# Patient Record
Sex: Male | Born: 1995 | Race: Black or African American | Hispanic: No | Marital: Single | Smoking: Current some day smoker
Health system: Southern US, Community
[De-identification: ages and names within clinical notes are randomized; demographics above are authoritative.]

---
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
## 2016-08-20 ENCOUNTER — Encounter (HOSPITAL_COMMUNITY): Payer: Self-pay | Admitting: Emergency Medicine

## 2016-08-20 DIAGNOSIS — Y999 Unspecified external cause status: Secondary | ICD-10-CM | POA: Insufficient documentation

## 2016-08-20 DIAGNOSIS — S60221A Contusion of right hand, initial encounter: Secondary | ICD-10-CM | POA: Insufficient documentation

## 2016-08-20 DIAGNOSIS — Y906 Blood alcohol level of 120-199 mg/100 ml: Secondary | ICD-10-CM | POA: Diagnosis not present

## 2016-08-20 DIAGNOSIS — Y929 Unspecified place or not applicable: Secondary | ICD-10-CM | POA: Diagnosis not present

## 2016-08-20 DIAGNOSIS — Y9389 Activity, other specified: Secondary | ICD-10-CM | POA: Diagnosis not present

## 2016-08-20 DIAGNOSIS — F329 Major depressive disorder, single episode, unspecified: Secondary | ICD-10-CM | POA: Diagnosis not present

## 2016-08-20 DIAGNOSIS — S6991XA Unspecified injury of right wrist, hand and finger(s), initial encounter: Secondary | ICD-10-CM | POA: Diagnosis present

## 2016-08-20 DIAGNOSIS — F17200 Nicotine dependence, unspecified, uncomplicated: Secondary | ICD-10-CM | POA: Diagnosis not present

## 2016-08-20 DIAGNOSIS — F10120 Alcohol abuse with intoxication, uncomplicated: Secondary | ICD-10-CM | POA: Diagnosis not present

## 2016-08-20 DIAGNOSIS — W228XXA Striking against or struck by other objects, initial encounter: Secondary | ICD-10-CM | POA: Insufficient documentation

## 2016-08-20 DIAGNOSIS — F10129 Alcohol abuse with intoxication, unspecified: Secondary | ICD-10-CM | POA: Insufficient documentation

## 2016-08-20 LAB — CBC
HCT: 45.9 % (ref 39.0–52.0)
HEMOGLOBIN: 16.1 g/dL (ref 13.0–17.0)
MCH: 30.7 pg (ref 26.0–34.0)
MCHC: 35.1 g/dL (ref 30.0–36.0)
MCV: 87.6 fL (ref 78.0–100.0)
PLATELETS: 235 10*3/uL (ref 150–400)
RBC: 5.24 MIL/uL (ref 4.22–5.81)
RDW: 12.3 % (ref 11.5–15.5)
WBC: 6.6 10*3/uL (ref 4.0–10.5)

## 2016-08-20 LAB — COMPREHENSIVE METABOLIC PANEL
ALT: 16 U/L — AB (ref 17–63)
AST: 22 U/L (ref 15–41)
Albumin: 4.7 g/dL (ref 3.5–5.0)
Alkaline Phosphatase: 52 U/L (ref 38–126)
Anion gap: 10 (ref 5–15)
BUN: 9 mg/dL (ref 6–20)
CHLORIDE: 104 mmol/L (ref 101–111)
CO2: 25 mmol/L (ref 22–32)
CREATININE: 1.16 mg/dL (ref 0.61–1.24)
Calcium: 9.4 mg/dL (ref 8.9–10.3)
GFR calc non Af Amer: >60 mL/min (ref >60)
Glucose, Bld: 88 mg/dL (ref 65–99)
POTASSIUM: 4.1 mmol/L (ref 3.5–5.1)
SODIUM: 139 mmol/L (ref 135–145)
Total Bilirubin: 0.5 mg/dL (ref 0.3–1.2)
Total Protein: 7.9 g/dL (ref 6.5–8.1)

## 2016-08-20 LAB — ACETAMINOPHEN LEVEL

## 2016-08-20 LAB — RAPID URINE DRUG SCREEN, HOSP PERFORMED
AMPHETAMINES: NOT DETECTED
BENZODIAZEPINES: NOT DETECTED
Barbiturates: NOT DETECTED
COCAINE: NOT DETECTED
OPIATES: NOT DETECTED
TETRAHYDROCANNABINOL: POSITIVE — AB

## 2016-08-20 LAB — SALICYLATE LEVEL

## 2016-08-20 LAB — ETHANOL: Alcohol, Ethyl (B): 146 mg/dL — ABNORMAL HIGH (ref <5)
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
## 2016-08-20 NOTE — ED Notes (Signed)
Called staffing to inform of need for sitter.  Pt changed in to purple scrubs.

## 2016-08-21 ENCOUNTER — Emergency Department (HOSPITAL_COMMUNITY): Payer: BLUE CROSS/BLUE SHIELD

## 2016-08-21 ENCOUNTER — Emergency Department (HOSPITAL_COMMUNITY)
Admission: EM | Admit: 2016-08-21 | Discharge: 2016-08-21 | Disposition: A | Discharge status: Home / Self Care | Payer: BLUE CROSS/BLUE SHIELD | Attending: Emergency Medicine | Admitting: Emergency Medicine

## 2016-08-21 DIAGNOSIS — F17200 Nicotine dependence, unspecified, uncomplicated: Secondary | ICD-10-CM | POA: Diagnosis not present

## 2016-08-21 DIAGNOSIS — S60221A Contusion of right hand, initial encounter: Secondary | ICD-10-CM | POA: Diagnosis not present

## 2016-08-21 DIAGNOSIS — F329 Major depressive disorder, single episode, unspecified: Secondary | ICD-10-CM

## 2016-08-21 DIAGNOSIS — F10929 Alcohol use, unspecified with intoxication, unspecified: Secondary | ICD-10-CM

## 2016-08-21 DIAGNOSIS — F10129 Alcohol abuse with intoxication, unspecified: Secondary | ICD-10-CM | POA: Diagnosis not present

## 2016-08-21 DIAGNOSIS — F32A Depression, unspecified: Secondary | ICD-10-CM

## 2016-08-21 MED ORDER — ONDANSETRON HCL 4 MG PO TABS: 4.0000 mg | ORAL_TABLET | Freq: Three times a day (TID) | ORAL | Status: DC | PRN

## 2016-08-21 MED ORDER — LORAZEPAM 1 MG PO TABS: 0.0000 mg | ORAL_TABLET | Freq: Four times a day (QID) | ORAL | Status: DC

## 2016-08-21 MED ORDER — NICOTINE 21 MG/24HR TD PT24: 21.0000 mg | MEDICATED_PATCH | Freq: Every day | TRANSDERMAL | Status: DC

## 2016-08-21 MED ORDER — ALUM & MAG HYDROXIDE-SIMETH 200-200-20 MG/5ML PO SUSP: 30.0000 mL | ORAL | Status: DC | PRN

## 2016-08-21 MED ORDER — VITAMIN B-1 100 MG PO TABS: 100.0000 mg | ORAL_TABLET | Freq: Every day | ORAL | Status: DC

## 2016-08-21 MED ORDER — IBUPROFEN 200 MG PO TABS: 600.0000 mg | ORAL_TABLET | Freq: Three times a day (TID) | ORAL | Status: DC | PRN

## 2016-08-21 MED ORDER — ACETAMINOPHEN 325 MG PO TABS: 650.0000 mg | ORAL_TABLET | ORAL | Status: DC | PRN

## 2016-08-21 MED ORDER — THIAMINE HCL 100 MG/ML IJ SOLN: 100.0000 mg | Freq: Every day | INTRAMUSCULAR | Status: DC

## 2016-08-21 MED ORDER — LORAZEPAM 1 MG PO TABS: 0.0000 mg | ORAL_TABLET | Freq: Two times a day (BID) | ORAL | Status: DC

## 2016-08-21 NOTE — ED Notes (Signed)
Pt moved into room TR10, given warm blanket and resting now. Mother, Isabelle Courseugenia left contact number: [phone].

## 2016-08-21 NOTE — ED Notes (Signed)
Patient transported to X-ray 

## 2016-08-21 NOTE — Discharge Instructions (Signed)
Avoid heavy consumption of alcohol.  Use Tylenol, if needed for pain.  Follow-up for counseling if needed, for depression and substance abuse

## 2016-08-21 NOTE — ED Notes (Signed)
States feels needs outpt resources.

## 2016-08-21 NOTE — ED Provider Notes (Signed)
[facility] Provider Note   CSN: [id number] Arrival date & time: 08/20/16  2106     History   Chief Complaint Chief Complaint  Patient presents with  . Suicidal  . Alcohol Intoxication    HPI Zachary Horton is a 21 y.o. male.  Patient presents to the emergency department for evaluation of increasing depression. Patient reports that he has financial as well as relationship troubles that have been causing him to become progressively more depressed. He has not taken any medications for depression, has no previous hospitalizations. He has been having some thoughts about suicide, but is not actively suicidal at this time. No hallucinations or homicidality. Patient complaining of pain in his right hand. He did strike an object earlier out of anger.      No past medical history on file.  There are no active problems to display for this patient.   No past surgical history on file.     Home Medications    Prior to Admission medications   Not on File    Family History No family history on file.  Social History Social History  Substance Use Topics  . Smoking status: Current Some Day Smoker  . Smokeless tobacco: Never Used  . Alcohol use Yes     Comment: socially     Allergies   Patient has no allergy information on record.   Review of Systems Review of Systems  Skin: Positive for wound (abrasion right hand).  Psychiatric/Behavioral: Positive for dysphoric mood.  All other systems reviewed and are negative.    Physical Exam Updated Vital Signs BP 104/67 (BP Location: Right Arm)   Pulse 106   Temp 97.9 F (36.6 C) (Oral)   Resp 12   SpO2 99%   Physical Exam  Constitutional: He is oriented to person, place, and time. He appears well-developed and well-nourished. No distress.  HENT:  Head: Normocephalic and atraumatic.  Right Ear: Hearing normal.  Left Ear: Hearing normal.  Nose: Nose normal.  Mouth/Throat: Oropharynx is clear and moist  and mucous membranes are normal.  Eyes: Conjunctivae and EOM are normal. Pupils are equal, round, and reactive to light.  Neck: Normal range of motion. Neck supple.  Cardiovascular: Regular rhythm, S1 normal and S2 normal.  Exam reveals no gallop and no friction rub.   No murmur heard. Pulmonary/Chest: Effort normal and breath sounds normal. No respiratory distress. He exhibits no tenderness.  Abdominal: Soft. Normal appearance and bowel sounds are normal. There is no hepatosplenomegaly. There is no tenderness. There is no rebound, no guarding, no tenderness at McBurney's point and negative Murphy's sign. No hernia.  Musculoskeletal: Normal range of motion.       Right hand: He exhibits tenderness. He exhibits normal range of motion, normal capillary refill and no deformity.  Superficial abrasions dorsal MCP joints right hand  Neurological: He is alert and oriented to person, place, and time. He has normal strength. No cranial nerve deficit or sensory deficit. Coordination normal. GCS eye subscore is 4. GCS verbal subscore is 5. GCS motor subscore is 6.  Skin: Skin is warm and dry. Abrasion noted. No rash noted. No cyanosis.  Psychiatric: His speech is normal. Thought content normal. He is slowed and withdrawn. Thought content is not paranoid and not delusional. He exhibits a depressed mood.  Nursing note and vitals reviewed.    ED Treatments / Results  Labs (all labs ordered are listed, but only abnormal results are displayed) Labs Reviewed  COMPREHENSIVE METABOLIC  PANEL - Abnormal; Notable for the following:       Result Value   ALT 16 (*)    All other components within normal limits  ETHANOL - Abnormal; Notable for the following:    Alcohol, Ethyl (B) 146 (*)    All other components within normal limits  ACETAMINOPHEN LEVEL - Abnormal; Notable for the following:    Acetaminophen (Tylenol), Serum <10 (*)    All other components within normal limits  RAPID URINE DRUG SCREEN, HOSP  PERFORMED - Abnormal; Notable for the following:    Tetrahydrocannabinol POSITIVE (*)    All other components within normal limits  SALICYLATE LEVEL  CBC    EKG  EKG Interpretation None       Radiology No results found.  Procedures Procedures (including critical care time)  Medications Ordered in ED Medications - No data to display   Initial Impression / Assessment and Plan / ED Course  I have reviewed the triage vital signs and the nursing notes.  Pertinent labs & imaging results that were available during my care of the patient were reviewed by me and considered in my medical decision making (see chart for details).  Clinical Course    Patient presents to the emergency department for evaluation of depression. Patient reports that he has multiple stressors causing his depression and he has been experiencing thoughts of harming himself, although not actively suicidal at this time. Will have behavioral health evaluate the patient for his needs.  Complaining of right hand pain after punching a hard object. X-ray hand performed, was negative.  Patient medically clear for psych eval.  Final Clinical Impressions(s) / ED Diagnoses   Final diagnoses:  Depression, unspecified depression type  Contusion of right hand, initial encounter    New Prescriptions New Prescriptions   No medications on file     Gilda Creasehristopher J Dvon Jiles, MD 08/21/16 [id number]

## 2016-08-21 NOTE — ED Notes (Signed)
Mother visiting w/pt - spoke w/Paige, [facility], SW, on phone - in agreement w/tx plan - d/c to home. Outpt resources given to pt - discussed w/mother and pt. Pt's father on way to ED w/pt's clothing.

## 2016-08-21 NOTE — ED Notes (Signed)
Dr Effie ShyWentz aware Idalia NeedlePaige, SW, [facility], called and advised recommending d/c w/outpt resources.

## 2016-08-21 NOTE — ED Notes (Signed)
Pt's father brought pt's clothing - pt changed into clothing. Pt and parents in agreement w/ d/c to home.

## 2016-08-21 NOTE — ED Notes (Signed)
A regular diet ordered for patient for lunch. 

## 2016-08-21 NOTE — ED Notes (Signed)
TTS being performed.  

## 2016-08-21 NOTE — ED Notes (Signed)
Patient was given a coke. 

## 2016-08-21 NOTE — ED Notes (Signed)
Pt ate breakfast then returned to sleeping.
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
## 2016-08-21 NOTE — [facility] Counselor (Addendum)
Writer spoke w/ pt's mom who arrived at pt's room. Writer and mom safety planned. Mom reports she wishes pt would talk to dad and her more often. She says pt will be living with them temporarily.   Evette Cristalaroline Paige Aarya Robinson, [place] Therapeutic Triage Specialist [phone]   Writer left voicemail for pt's mom Verdie Mosherugenia Johnson [phone].   Evette Cristalaroline Paige Kiriana Worthington, [place] Therapeutic Triage Specialist [phone]
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
## 2018-09-19 DIAGNOSIS — M79641 Pain in right hand: Secondary | ICD-10-CM | POA: Diagnosis not present

## 2020-08-01 ENCOUNTER — Encounter (HOSPITAL_COMMUNITY): Payer: Self-pay | Admitting: Family Medicine

## 2020-08-01 ENCOUNTER — Other Ambulatory Visit: Payer: Self-pay

## 2020-08-01 ENCOUNTER — Ambulatory Visit (HOSPITAL_COMMUNITY)
Admission: EM | Admit: 2020-08-01 | Discharge: 2020-08-01 | Disposition: A | Discharge status: Home / Self Care | Payer: HRSA Program | Attending: Family Medicine | Admitting: Family Medicine

## 2020-08-01 DIAGNOSIS — Z20822 Contact with and (suspected) exposure to covid-19: Secondary | ICD-10-CM | POA: Diagnosis present

## 2020-08-01 DIAGNOSIS — U071 COVID-19: Secondary | ICD-10-CM | POA: Diagnosis not present

## 2020-08-01 DIAGNOSIS — B349 Viral infection, unspecified: Secondary | ICD-10-CM

## 2020-08-01 NOTE — ED Triage Notes (Signed)
Pt reports feeling bad over weekend ,Loss of smell and taste . Pt also felt weak . Taste and smell have improved.

## 2020-08-01 NOTE — ED Provider Notes (Signed)
[facility]    CSN: [id number] Arrival date & time: 08/01/20  1451      History   Chief Complaint Chief Complaint  Patient presents with   Sore Throat    HPI Zachary Horton is a 24 y.o. male.   Patient is a 24 year old male presents today with reports of loss of taste and smell, sore throat, overall just feeling bad the started Friday.  Reporting improved throughout the weekend.  Today he tried to eat something and noticed that he cannot taste or smell and was concerned about Covid.  Here for Covid testing.  Otherwise he is feeling better.      History reviewed. No pertinent past medical history.  There are no problems to display for this patient.   History reviewed. No pertinent surgical history.     Home Medications    Prior to Admission medications   Not on File    Family History History reviewed. No pertinent family history.  Social History Social History   Tobacco Use   Smoking status: Current Some Day Smoker   Smokeless tobacco: Never Used  Substance Use Topics   Alcohol use: Yes    Comment: socially   Drug use: No     Allergies   Patient has no known allergies.   Review of Systems Review of Systems   Physical Exam Triage Vital Signs ED Triage Vitals  Enc Vitals Group     BP 08/01/20 1642 116/66     Pulse Rate 08/01/20 1642 81     Resp 08/01/20 1642 16     Temp 08/01/20 1642 99.4 F (37.4 C)     Temp Source 08/01/20 1642 Oral     SpO2 08/01/20 1642 99 %     Weight --      Height --      Head Circumference --      Peak Flow --      Pain Score 08/01/20 1644 0     Pain Loc --      Pain Edu? --      Excl. in GC? --    No data found.  Updated Vital Signs BP 116/66 (BP Location: Left Arm)    Pulse 81    Temp 99.4 F (37.4 C) (Oral)    Resp 16    SpO2 99%   Visual Acuity Right Eye Distance:   Left Eye Distance:   Bilateral Distance:    Right Eye Near:   Left Eye Near:    Bilateral Near:     Physical  Exam Vitals and nursing note reviewed.  Constitutional:      Appearance: Normal appearance.  HENT:     Head: Normocephalic and atraumatic.     Nose: Nose normal.  Eyes:     Conjunctiva/sclera: Conjunctivae normal.  Pulmonary:     Effort: Pulmonary effort is normal.  Musculoskeletal:        General: Normal range of motion.     Cervical back: Normal range of motion.  Skin:    General: Skin is warm and dry.  Neurological:     Mental Status: He is alert.  Psychiatric:        Mood and Affect: Mood normal.      UC Treatments / Results  Labs (all labs ordered are listed, but only abnormal results are displayed) Labs Reviewed - No data to display  EKG   Radiology No results found.  Procedures Procedures (including critical care time)  Medications Ordered  in UC Medications - No data to display  Initial Impression / Assessment and Plan / UC Course  I have reviewed the triage vital signs and the nursing notes.  Pertinent labs & imaging results that were available during my care of the patient were reviewed by me and considered in my medical decision making (see chart for details).     Viral illness Covid test pending.  Final Clinical Impressions(s) / UC Diagnoses   Final diagnoses:  Viral illness     Discharge Instructions     Covid test pending.  Check my chart for results     ED Prescriptions    None     PDMP not reviewed this encounter.   Janace Aris, NP 08/01/20 1702

## 2020-08-01 NOTE — Discharge Instructions (Addendum)
Covid test pending.  Check my chart for results

## 2020-08-02 LAB — SARS CORONAVIRUS 2 (TAT 6-24 HRS): SARS Coronavirus 2: POSITIVE — AB

## 2020-08-03 ENCOUNTER — Telehealth (HOSPITAL_COMMUNITY): Payer: Self-pay

## 2020-08-03 NOTE — Telephone Encounter (Signed)
Called patient to pre-screen for monoclonal antibody infusion after receiving recent positive test. Patient qualifies based off off co-morbid condition and/or member of an at risk group. Patient reports feeling "great" and does not want the infusion.  Christopher Glasscock Loyola Mast, RN
